# Patient Record
Sex: Male | Born: 1980 | Race: White | Hispanic: Yes | Marital: Single | Smoking: Never smoker
Health system: Southern US, Community
[De-identification: ages and names within clinical notes are randomized; demographics above are authoritative.]

---

## 2005-02-28 ENCOUNTER — Emergency Department (HOSPITAL_COMMUNITY): Admission: EM | Admit: 2005-02-28 | Discharge: 2005-02-28 | Payer: Self-pay | Admitting: Emergency Medicine

## 2005-03-02 ENCOUNTER — Emergency Department (HOSPITAL_COMMUNITY): Admission: EM | Admit: 2005-03-02 | Discharge: 2005-03-02 | Payer: Self-pay | Admitting: Emergency Medicine

## 2008-04-14 ENCOUNTER — Emergency Department (HOSPITAL_COMMUNITY): Admission: EM | Admit: 2008-04-14 | Discharge: 2008-04-14 | Payer: Self-pay | Admitting: Emergency Medicine

## 2009-10-10 ENCOUNTER — Emergency Department (HOSPITAL_COMMUNITY): Admission: EM | Admit: 2009-10-10 | Discharge: 2009-10-10 | Payer: Self-pay | Admitting: Emergency Medicine

## 2009-11-13 ENCOUNTER — Emergency Department (HOSPITAL_COMMUNITY): Admission: EM | Admit: 2009-11-13 | Discharge: 2009-11-14 | Payer: Self-pay | Admitting: Emergency Medicine

## 2009-11-18 ENCOUNTER — Encounter (HOSPITAL_COMMUNITY): Admission: RE | Admit: 2009-11-18 | Discharge: 2009-12-18 | Payer: Self-pay | Admitting: Oncology

## 2010-10-19 LAB — COMPREHENSIVE METABOLIC PANEL
ALT: 63 U/L — ABNORMAL HIGH (ref 0–53)
AST: 37 U/L (ref 0–37)
BUN: 16 mg/dL (ref 6–23)
Creatinine, Ser: 0.84 mg/dL (ref 0.4–1.5)
GFR calc Af Amer: >60 mL/min (ref >60)
GFR calc non Af Amer: >60 mL/min (ref >60)
Potassium: 3.8 mEq/L (ref 3.5–5.1)
Sodium: 141 mEq/L (ref 135–145)
Total Bilirubin: 0.6 mg/dL (ref 0.3–1.2)

## 2010-10-19 LAB — URINALYSIS, ROUTINE W REFLEX MICROSCOPIC
Bilirubin Urine: NEGATIVE
Hgb urine dipstick: NEGATIVE
Ketones, ur: NEGATIVE mg/dL
Nitrite: NEGATIVE
Specific Gravity, Urine: >1.03 — ABNORMAL HIGH (ref 1.005–1.030)
Urobilinogen, UA: 0.2 mg/dL (ref 0.0–1.0)

## 2010-10-19 LAB — DIFFERENTIAL
Basophils Absolute: 0.1 10*3/uL (ref 0.0–0.1)
Monocytes Absolute: 0.6 10*3/uL (ref 0.1–1.0)
Monocytes Relative: 6 % (ref 3–12)

## 2010-10-19 LAB — CBC
HCT: 38.5 % — ABNORMAL LOW (ref 39.0–52.0)
Hemoglobin: 13.7 g/dL (ref 13.0–17.0)
MCHC: 35.5 g/dL (ref 30.0–36.0)

## 2010-10-24 LAB — COMPREHENSIVE METABOLIC PANEL
AST: 25 U/L (ref 0–37)
BUN: 14 mg/dL (ref 6–23)
Chloride: 107 mEq/L (ref 96–112)
GFR calc non Af Amer: >60 mL/min (ref >60)
Glucose, Bld: 99 mg/dL (ref 70–99)
Potassium: 3.8 mEq/L (ref 3.5–5.1)
Total Bilirubin: 0.7 mg/dL (ref 0.3–1.2)

## 2010-10-24 LAB — URINE MICROSCOPIC-ADD ON

## 2010-10-24 LAB — URINALYSIS, ROUTINE W REFLEX MICROSCOPIC
Bilirubin Urine: NEGATIVE
Glucose, UA: NEGATIVE mg/dL
Leukocytes, UA: NEGATIVE
Specific Gravity, Urine: >1.03 — ABNORMAL HIGH (ref 1.005–1.030)
pH: 6 (ref 5.0–8.0)

## 2010-10-24 LAB — CBC
RBC: 4.53 MIL/uL (ref 4.22–5.81)
RDW: 14.1 % (ref 11.5–15.5)
WBC: 6 10*3/uL (ref 4.0–10.5)

## 2010-10-24 LAB — URINE CULTURE
Colony Count: NO GROWTH
Culture: NO GROWTH

## 2010-10-24 LAB — DIFFERENTIAL
Basophils Absolute: 0 10*3/uL (ref 0.0–0.1)
Eosinophils Relative: 2 % (ref 0–5)
Lymphocytes Relative: 35 % (ref 12–46)

## 2010-10-24 LAB — LIPASE, BLOOD: Lipase: 18 U/L (ref 11–59)

## 2011-04-18 IMAGING — CT CT ABD-PELV W/O CM
2 of 4 series · 17 of 46 positions shown, 19 images · non-contrast
Comparison: None.

CLINICAL DATA: Abdominal pain.  Right-sided flank pain and groin
pain.  Difficulty urinating.  History of kidney problems as a
child.  No visible hematuria.

CT ABDOMEN AND PELVIS WITHOUT CONTRAST
TECHNIQUE: Multidetector CT imaging of the abdomen and pelvis was
performed following the standard protocol without intravenous
contrast.

[Series 2: standard/full over (age)lbs 5.0 · axial · 0.66mm/px · z∈[-474,-38]mm · 14 of 95 slices shown, 16 images]
[im 4/95  soft-tissue]
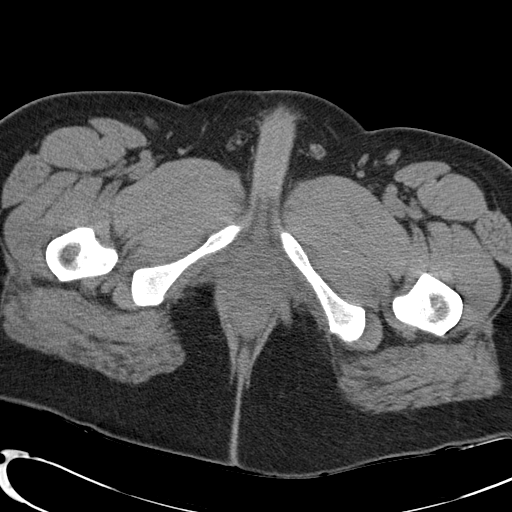
[im 4/95  bone]
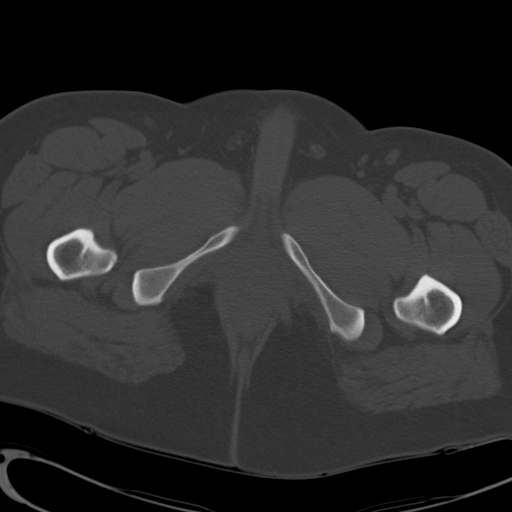
[im 12/95  soft-tissue]
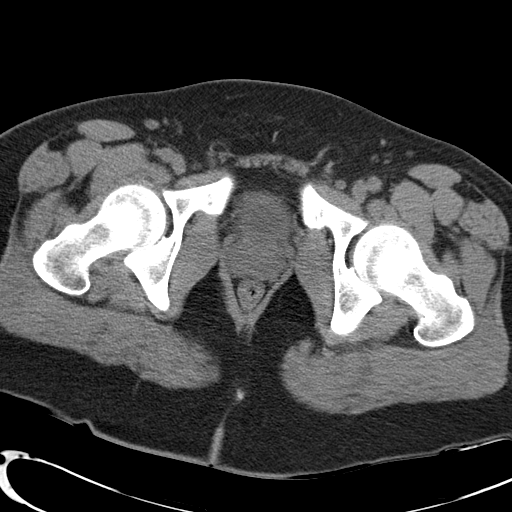
[im 19/95  soft-tissue]
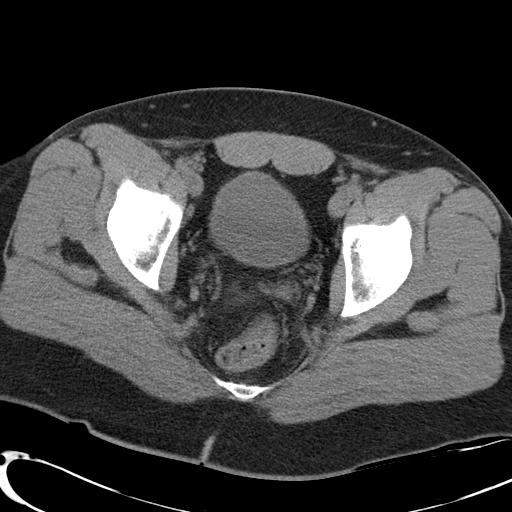
[im 27/95  soft-tissue]
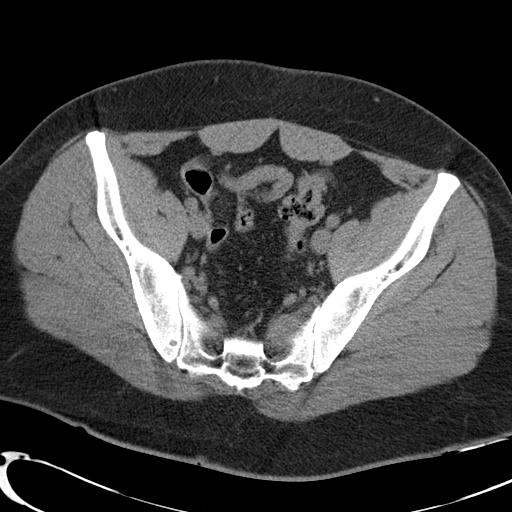
[im 31/95  soft-tissue]
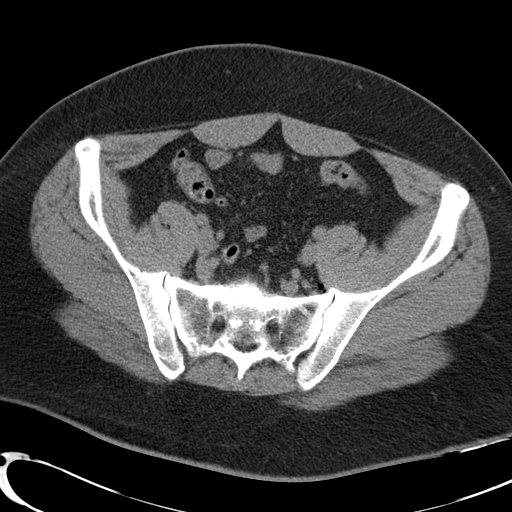
[im 38/95  soft-tissue]
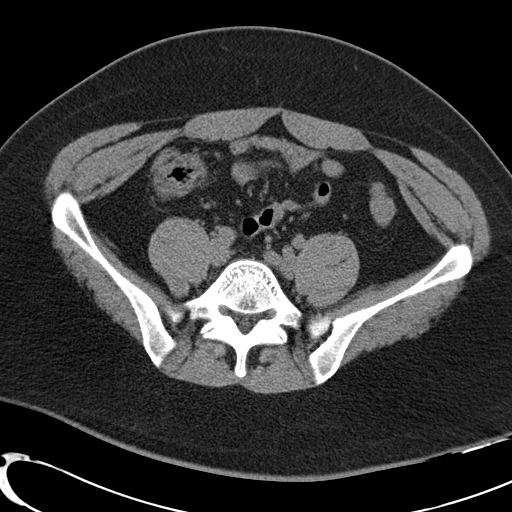
[im 46/95  soft-tissue]
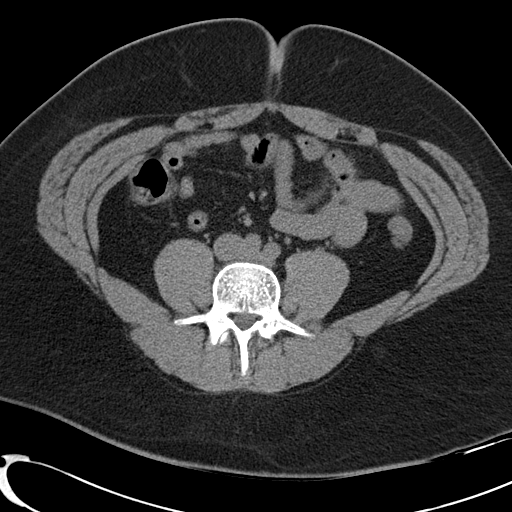
[im 49/95  soft-tissue]
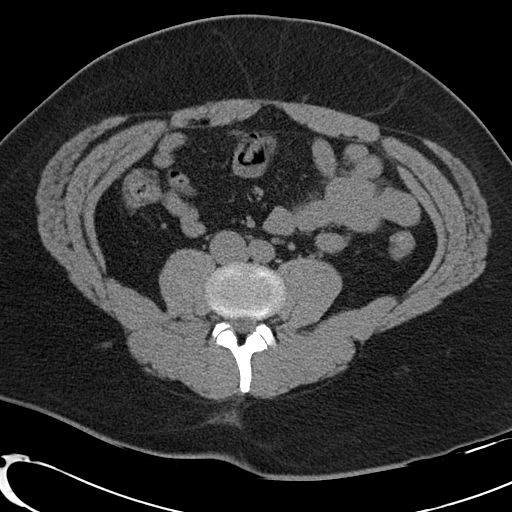
[im 57/95  soft-tissue]
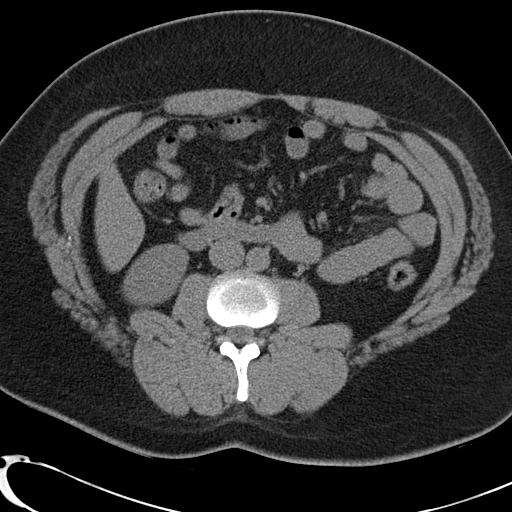
[im 57/95  bone]
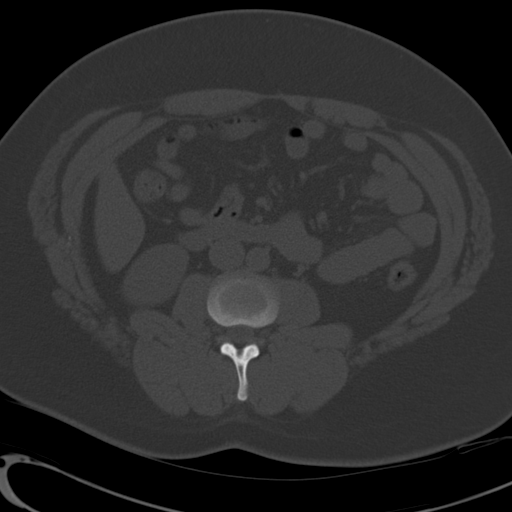
[im 64/95  soft-tissue]
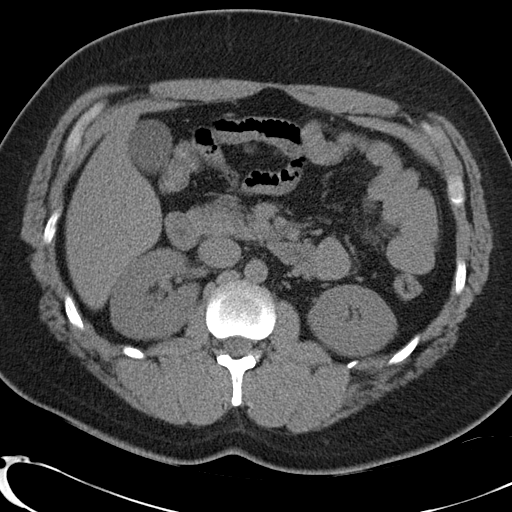
[im 72/95  soft-tissue]
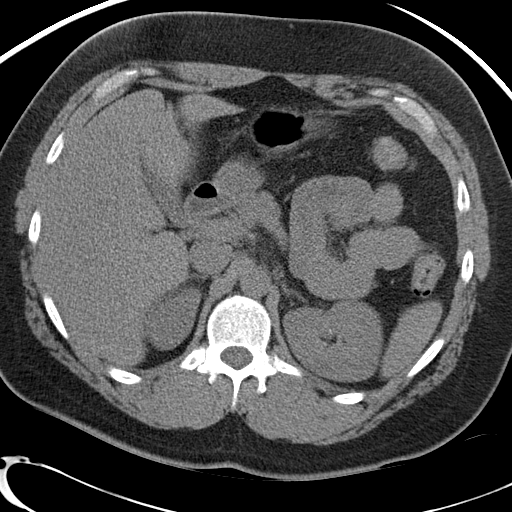
[im 76/95  soft-tissue]
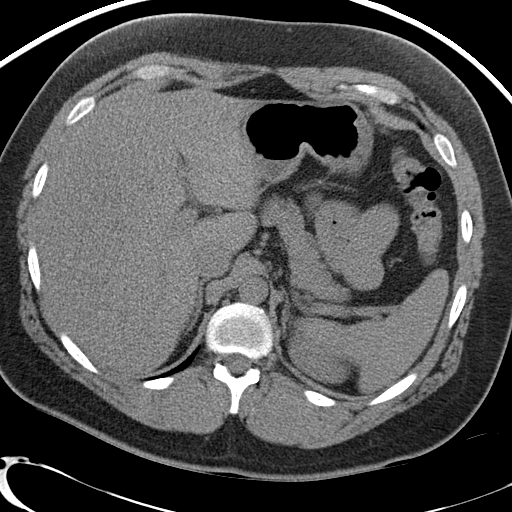
[im 83/95  soft-tissue]
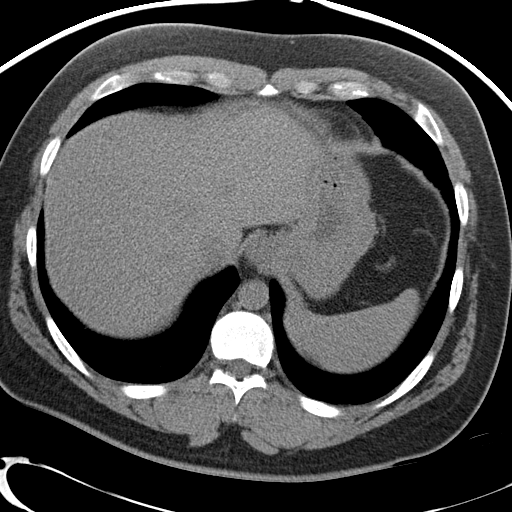
[im 91/95  soft-tissue]
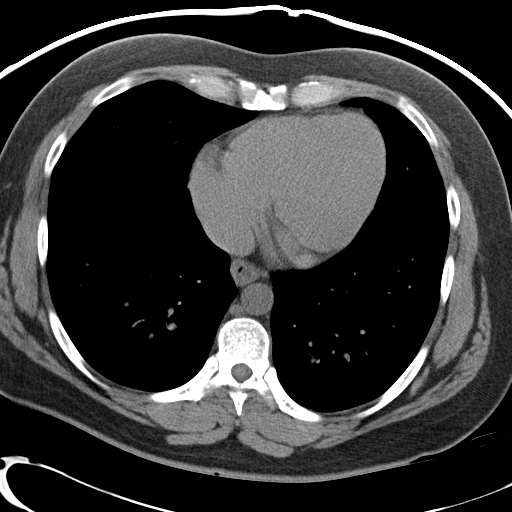

[Series 4: mpr coronal · coronal · 0.77mm/px · 3 of 91 slices shown]
[im 31/91  soft-tissue]
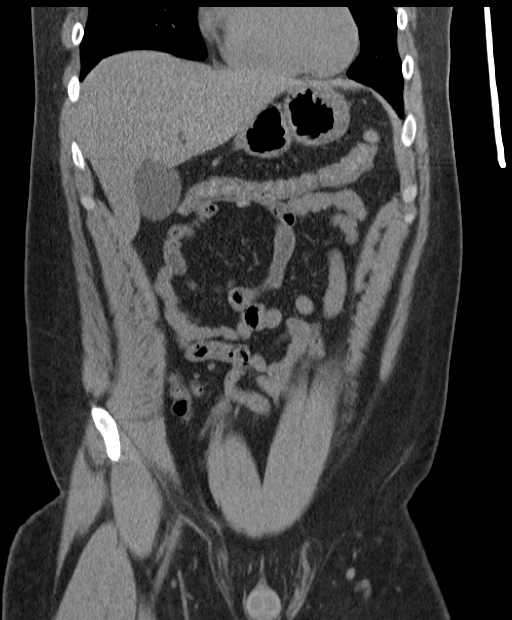
[im 41/91  soft-tissue]
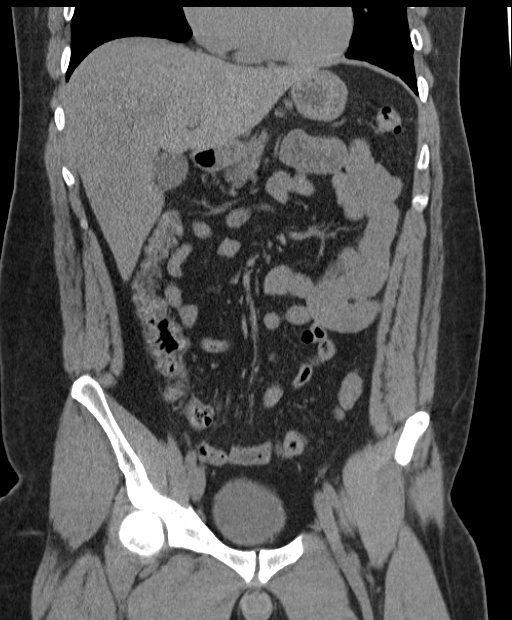
[im 51/91  soft-tissue]
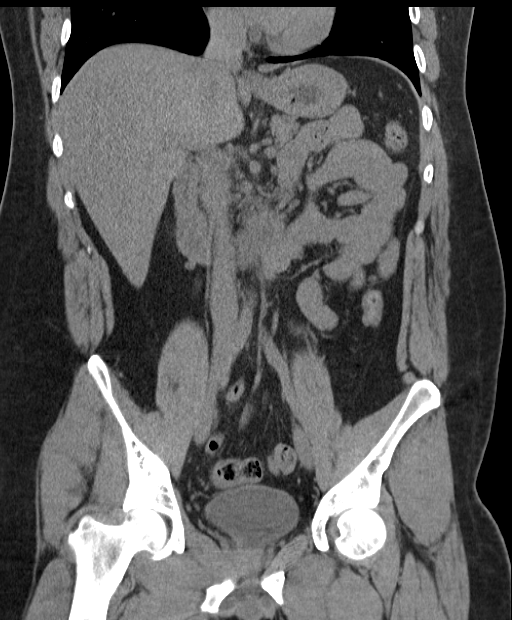

[17 of 46 positions shown; findings below may reference images not displayed]

FINDINGS: Images of the lung bases are unremarkable.  No intra
renal or ureteral calculi identified.  No focal abnormality within
the liver, spleen, pancreas, adrenal glands, kidneys.  The
gallbladder is present.  No retroperitoneal adenopathy or ascites.
The appendix is well seen and has a normal appearance.  No
dilatation of bowel loops or bowel wall thickening.  No
retroperitoneal adenopathy or ascites.  The aorta is normal in
appearance. There is no free pelvic fluid or pelvic adenopathy.
IMPRESSION: 1.  No evidence for urinary tract calculi.
2. No evidence for acute abdominal or pelvic abnormality.

## 2011-06-11 ENCOUNTER — Emergency Department (HOSPITAL_COMMUNITY)
Admission: EM | Admit: 2011-06-11 | Discharge: 2011-06-11 | Disposition: A | Discharge status: Home / Self Care | Payer: Worker's Compensation | Attending: Emergency Medicine | Admitting: Emergency Medicine

## 2011-06-11 ENCOUNTER — Encounter: Payer: Self-pay | Admitting: *Deleted

## 2011-06-11 ENCOUNTER — Emergency Department (HOSPITAL_COMMUNITY): Payer: Worker's Compensation

## 2011-06-11 DIAGNOSIS — F17200 Nicotine dependence, unspecified, uncomplicated: Secondary | ICD-10-CM | POA: Insufficient documentation

## 2011-06-11 DIAGNOSIS — S6000XA Contusion of unspecified finger without damage to nail, initial encounter: Secondary | ICD-10-CM | POA: Insufficient documentation

## 2011-06-11 DIAGNOSIS — M79609 Pain in unspecified limb: Secondary | ICD-10-CM | POA: Insufficient documentation

## 2011-06-11 DIAGNOSIS — Y990 Civilian activity done for income or pay: Secondary | ICD-10-CM | POA: Insufficient documentation

## 2011-06-11 DIAGNOSIS — IMO0002 Reserved for concepts with insufficient information to code with codable children: Secondary | ICD-10-CM | POA: Insufficient documentation

## 2011-06-11 DIAGNOSIS — S60011A Contusion of right thumb without damage to nail, initial encounter: Secondary | ICD-10-CM

## 2011-06-11 DIAGNOSIS — R209 Unspecified disturbances of skin sensation: Secondary | ICD-10-CM | POA: Insufficient documentation

## 2011-06-11 MED ORDER — KETOROLAC TROMETHAMINE 60 MG/2ML IM SOLN
60.0000 mg | Freq: Once | INTRAMUSCULAR | Status: AC
  Administered 2011-06-11: 60 mg via INTRAMUSCULAR
  Filled 2011-06-11: qty 2

## 2011-06-11 MED ORDER — NAPROXEN 500 MG PO TABS: 500.0000 mg | ORAL_TABLET | Freq: Two times a day (BID) | ORAL | Status: DC

## 2011-06-11 NOTE — ED Notes (Signed)
Pt carried to lab for uds for worker comp.

## 2011-06-11 NOTE — ED Notes (Signed)
Pt given discharge instructions, paperwork & prescription(s), pt verbalized understanding.   

## 2011-06-11 NOTE — ED Provider Notes (Signed)
History     CSN: [id number] Arrival date & time: 06/11/2011  2:15 AM   First MD Initiated Contact with Patient 06/11/11 [phone]      Chief Complaint  Patient presents with  . Hand Injury    (Consider location/radiation/quality/duration/timing/severity/associated sxs/prior treatment) HPI Comments: Patient states that just prior to arrival at work he struck his thumb on the right hand on a hard surface at work. This was acute in onset, the pain is constant, worse with palpation and not associated with numbness or weakness. He denies deformity but has some bruising to the dorsal aspect of the base of the thumb.  Patient is a 30 y.o. male presenting with hand injury. The history is provided by the patient.  Hand Injury  The incident occurred 1 to 2 hours ago. The incident occurred at work. The injury mechanism was a direct blow. Pain location: Right thumb and hand. The quality of the pain is described as throbbing and aching. The pain is moderate. The pain has been constant since the incident. The symptoms are aggravated by movement and palpation. He has tried ice for the symptoms. The treatment provided no relief.   History reviewed. No pertinent past medical history.  History reviewed. No pertinent past surgical history.  History reviewed. No pertinent family history.  History  Substance Use Topics  . Smoking status: Current Everyday Smoker  . Smokeless tobacco: Not on file  . Alcohol Use: No      Review of Systems  Gastrointestinal: Negative for nausea.  Skin:       Bruising  Neurological: Negative for numbness.    Allergies  Review of patient's allergies indicates no known allergies.  Home Medications   Current Outpatient Rx  Name Route Sig Dispense Refill  . IBUPROFEN 800 MG PO TABS Oral Take 800 mg by mouth every 8 (eight) hours as needed.      Marland Kitchen NAPROXEN 500 MG PO TABS Oral Take 1 tablet (500 mg total) by mouth 2 (two) times daily. 30 tablet 0    BP 145/89   Pulse 72  Temp 97.6 F (36.4 C)  Resp 20  Ht 5\' 3"  (1.6 m)  Wt 180 lb (81.647 kg)  BMI 31.89 kg/m2  SpO2 100%  Physical Exam  Constitutional: He appears well-developed and well-nourished. No distress.  Eyes: Conjunctivae are normal. No scleral icterus.  Cardiovascular: Normal rate, regular rhythm and normal heart sounds.   Pulmonary/Chest: Effort normal and breath sounds normal.  Musculoskeletal: He exhibits tenderness ( Tender to palpation over the base of the right thumb and over the MCP. Decreased range of motion of this joint with mild bruising dorsally). He exhibits no edema.  Neurological: He is alert.       Normal gait, normal sensation to right and and thumb  Skin: Skin is warm and dry. He is not diaphoretic.       Mild bruising to the dorsal aspect of the right proximal first digit    ED Course  Procedures (including critical care time)  Labs Reviewed - No data to display Dg Finger Thumb Right  06/11/2011  *RADIOLOGY REPORT*  Clinical Data: Blunt trauma to the base of the first digit of the right hand, with pain and numbness.  RIGHT THUMB 2+V  Comparison: None.  Findings: There is no evidence of fracture or dislocation. Visualized joint spaces are preserved.  The visualized portions of the carpal rows appear intact.  No significant soft tissue abnormalities are characterized on radiograph.  IMPRESSION:  No evidence of fracture or dislocation.  Original Report Authenticated By: Tonia Ghent, M.D.     1. Contusion of right thumb       MDM  X-ray pending rule out fracture, suspect contusion with possible fracture, ice, elevation, anti-inflammatories.  X-ray shows no signs of fracture, Toradol given in the emergency department, referral for orthopedics when necessary poor improvement      Vida Roller, MD 06/11/11 [phone]

## 2011-06-11 NOTE — ED Notes (Signed)
Pt c/o right hand/thumb pain.

## 2011-07-08 ENCOUNTER — Encounter (HOSPITAL_COMMUNITY): Payer: Self-pay

## 2011-07-08 ENCOUNTER — Emergency Department (HOSPITAL_COMMUNITY)
Admission: EM | Admit: 2011-07-08 | Discharge: 2011-07-08 | Disposition: A | Discharge status: Home / Self Care | Payer: Self-pay | Attending: Emergency Medicine | Admitting: Emergency Medicine

## 2011-07-08 DIAGNOSIS — M549 Dorsalgia, unspecified: Secondary | ICD-10-CM | POA: Insufficient documentation

## 2011-07-08 DIAGNOSIS — F17200 Nicotine dependence, unspecified, uncomplicated: Secondary | ICD-10-CM | POA: Insufficient documentation

## 2011-07-08 DIAGNOSIS — J3489 Other specified disorders of nose and nasal sinuses: Secondary | ICD-10-CM | POA: Insufficient documentation

## 2011-07-08 DIAGNOSIS — R1031 Right lower quadrant pain: Secondary | ICD-10-CM

## 2011-07-08 DIAGNOSIS — R6883 Chills (without fever): Secondary | ICD-10-CM | POA: Insufficient documentation

## 2011-07-08 DIAGNOSIS — IMO0001 Reserved for inherently not codable concepts without codable children: Secondary | ICD-10-CM | POA: Insufficient documentation

## 2011-07-08 DIAGNOSIS — R05 Cough: Secondary | ICD-10-CM

## 2011-07-08 DIAGNOSIS — R059 Cough, unspecified: Secondary | ICD-10-CM | POA: Insufficient documentation

## 2011-07-08 DIAGNOSIS — R109 Unspecified abdominal pain: Secondary | ICD-10-CM | POA: Insufficient documentation

## 2011-07-08 DIAGNOSIS — R070 Pain in throat: Secondary | ICD-10-CM | POA: Insufficient documentation

## 2011-07-08 MED ORDER — HYDROCODONE-ACETAMINOPHEN 5-325 MG PO TABS
1.0000 | ORAL_TABLET | Freq: Once | ORAL | Status: AC
  Administered 2011-07-08: 1 via ORAL
  Filled 2011-07-08: qty 1

## 2011-07-08 MED ORDER — AZITHROMYCIN 250 MG PO TABS: ORAL_TABLET | ORAL | Status: AC

## 2011-07-08 MED ORDER — GUAIFENESIN-CODEINE 100-10 MG/5ML PO SYRP: 10.0000 mL | ORAL_SOLUTION | Freq: Three times a day (TID) | ORAL | Status: AC | PRN

## 2011-07-08 MED ORDER — AZITHROMYCIN 250 MG PO TABS
500.0000 mg | ORAL_TABLET | Freq: Once | ORAL | Status: AC
  Administered 2011-07-08: 500 mg via ORAL
  Filled 2011-07-08: qty 2

## 2011-07-08 NOTE — ED Notes (Signed)
1. ?boil to lower back area for 1 week 2. Body aches, cough and sore throat for 1 week

## 2011-07-08 NOTE — ED Provider Notes (Signed)
History     CSN: [id number] Arrival date & time: 07/08/2011  7:44 PM   First MD Initiated Contact with Patient 07/08/11 1946      Chief Complaint  Patient presents with  . Wound Check  . Cough    body aches    (Consider location/radiation/quality/duration/timing/severity/associated sxs/prior treatment) HPI Comments: Patient c/o body aches, productive cough, chills and sore throat for one week.  He states that he was seen last year for a "torn groin muscle" last year and states that his persistent coughing has caused pain to the previously injured area.  He describes pain to his right groin and right lower back.  He denies masses, vomiting, hematuria, discharge from his penis or difficulty urinating.    Patient is a 30 y.o. male presenting with cough. The history is provided by the patient.  Cough This is a new problem. The current episode started more than 2 days ago. The problem occurs every few minutes. The problem has not changed since onset.The cough is productive of sputum. There has been no fever. Associated symptoms include chills, rhinorrhea, sore throat and myalgias. Pertinent negatives include no chest pain, no ear congestion, no ear pain, no headaches, no shortness of breath and no wheezing. He has tried nothing for the symptoms. The treatment provided no relief. He is a smoker. His past medical history does not include bronchitis, pneumonia, COPD, emphysema or asthma.    History reviewed. No pertinent past medical history.  History reviewed. No pertinent past surgical history.  History reviewed. No pertinent family history.  History  Substance Use Topics  . Smoking status: Current Everyday Smoker  . Smokeless tobacco: Not on file  . Alcohol Use: No      Review of Systems  Constitutional: Positive for chills. Negative for fever, activity change and appetite change.  HENT: Positive for sore throat and rhinorrhea. Negative for ear pain, trouble swallowing, neck pain  and neck stiffness.   Respiratory: Positive for cough. Negative for chest tightness, shortness of breath and wheezing.   Cardiovascular: Negative for chest pain.  Gastrointestinal: Negative for nausea, vomiting, abdominal pain, diarrhea and abdominal distention.  Genitourinary: Negative for dysuria, hematuria, flank pain, decreased urine volume, discharge, penile swelling and testicular pain.  Musculoskeletal: Positive for myalgias and back pain. Negative for arthralgias.  Skin: Negative.  Negative for color change, rash and wound.  Neurological: Negative for dizziness, weakness, numbness and headaches.  Hematological: Does not bruise/bleed easily.  All other systems reviewed and are negative.    Allergies  Review of patient's allergies indicates no known allergies.  Home Medications  No current outpatient prescriptions on file.  BP 124/83  Pulse 78  Temp(Src) 98.1 F (36.7 C) (Oral)  Resp 22  Ht 5\' 3"  (1.6 m)  Wt 185 lb (83.915 kg)  BMI 32.77 kg/m2  SpO2 100%  Physical Exam  Nursing note and vitals reviewed. Constitutional: He is oriented to person, place, and time. Vital signs are normal. He appears well-developed and well-nourished.  Non-toxic appearance. He does not have a sickly appearance. He does not appear ill. No distress.  HENT:  Head: Normocephalic and atraumatic.  Right Ear: Tympanic membrane and ear canal normal.  Left Ear: Tympanic membrane and ear canal normal.  Nose: Nose normal.  Mouth/Throat: Uvula is midline and mucous membranes are normal. Posterior oropharyngeal erythema present. No oropharyngeal exudate, posterior oropharyngeal edema or tonsillar abscesses.  Neck: Normal range of motion. Neck supple.  Cardiovascular: Normal rate, regular rhythm and normal heart  sounds.   Pulmonary/Chest: Effort normal and breath sounds normal. No respiratory distress. He has no wheezes. He has no rales. He exhibits no tenderness.       Coarse lung sounds throughout.  No  rales or wheezes  Abdominal: Soft. He exhibits no distension and no mass. There is no tenderness. There is no rebound and no guarding.  Musculoskeletal: Normal range of motion. He exhibits tenderness. He exhibits no edema.       Lumbar back: He exhibits tenderness. He exhibits normal range of motion, no swelling and normal pulse.       Back:       Legs: Lymphadenopathy:    He has no cervical adenopathy.       Right: No inguinal adenopathy present.  Neurological: He is alert and oriented to person, place, and time. He has normal reflexes. No cranial nerve deficit. He exhibits normal muscle tone. Coordination normal.  Skin: Skin is warm and dry.    ED Course  Procedures (including critical care time)       MDM    9:46 PM patient has ttp of the right lower lumbar paraspinal muscles and right groin area.  Pain is reproduced with abduction and flexion of the right leg.  PAtient has hx of "right inguinal hernia" March of 2011.  PAin tonight feels the same and began after excessive cough.  Vitals are stable, he is non-toxic appearing, abd is soft, NT, no guarding or rebound tenderness.  No palpable masses of the abd or inguinal area.  I will prescribe cough medication and abx, he agrees to close f/u with his PMD   I have reviewed the previous ED chart and imaging.  Pt was referred to Dr. Franky Macho        Daelen Belvedere L. Detra Bores, PA 07/11/11 1344

## 2011-07-08 NOTE — ED Notes (Signed)
Pt a/ox4. resp even and unlabored. NAD at this time. D/C instructions reviewed with pt. Pt verbalized understanding. Pt ambulated to lobby with steady gate.  

## 2011-07-15 NOTE — ED Provider Notes (Signed)
Evaluation and management procedures were performed by the PA/NP under my supervision/collaboration.    Courtne Lighty D Zyiere Rosemond, MD 07/15/11 1137 

## 2014-10-09 ENCOUNTER — Emergency Department (HOSPITAL_COMMUNITY)
Admission: EM | Admit: 2014-10-09 | Discharge: 2014-10-09 | Disposition: A | Discharge status: Home / Self Care | Payer: Worker's Compensation | Attending: Emergency Medicine | Admitting: Emergency Medicine

## 2014-10-09 ENCOUNTER — Emergency Department (HOSPITAL_COMMUNITY): Payer: Worker's Compensation

## 2014-10-09 ENCOUNTER — Encounter (HOSPITAL_COMMUNITY): Payer: Self-pay | Admitting: Emergency Medicine

## 2014-10-09 DIAGNOSIS — Y9289 Other specified places as the place of occurrence of the external cause: Secondary | ICD-10-CM | POA: Insufficient documentation

## 2014-10-09 DIAGNOSIS — M79672 Pain in left foot: Secondary | ICD-10-CM

## 2014-10-09 DIAGNOSIS — Y9389 Activity, other specified: Secondary | ICD-10-CM | POA: Insufficient documentation

## 2014-10-09 DIAGNOSIS — S93602A Unspecified sprain of left foot, initial encounter: Secondary | ICD-10-CM

## 2014-10-09 DIAGNOSIS — X58XXXA Exposure to other specified factors, initial encounter: Secondary | ICD-10-CM | POA: Insufficient documentation

## 2014-10-09 DIAGNOSIS — Y990 Civilian activity done for income or pay: Secondary | ICD-10-CM | POA: Insufficient documentation

## 2014-10-09 DIAGNOSIS — Z720 Tobacco use: Secondary | ICD-10-CM | POA: Insufficient documentation

## 2014-10-09 MED ORDER — OXYCODONE-ACETAMINOPHEN 5-325 MG PO TABS: 1.0000 | ORAL_TABLET | ORAL | Status: AC | PRN

## 2014-10-09 MED ORDER — OXYCODONE-ACETAMINOPHEN 5-325 MG PO TABS
1.0000 | ORAL_TABLET | Freq: Once | ORAL | Status: AC
  Administered 2014-10-09: 1 via ORAL
  Filled 2014-10-09: qty 1

## 2014-10-09 MED ORDER — IBUPROFEN 800 MG PO TABS
800.0000 mg | ORAL_TABLET | Freq: Once | ORAL | Status: AC
  Administered 2014-10-09: 800 mg via ORAL
  Filled 2014-10-09: qty 1

## 2014-10-09 NOTE — Discharge Instructions (Signed)
Esguince del pie (Foot Sprain) Los msculos y los tendones (estructuras similares a cuerdas que unen el msculo al hueso) que rodean el pie estn formados por unidades. El esguince se produce en el punto ms dbil de esas unidades. Este trastorno generalmente est ocasionado por una lesin o un uso excesivo del pie, como por ejemplo en la prctica de deportes, o cuando se agrava una lesin anterior, o debido a condiciones deficientes, o en casos de obesidad. SNTOMAS   Dolor con Conservation officer, historic buildings.  Sensibilidad e hinchazn de la zona lesionada.  Prdida de la fuerza en los esguinces moderados o graves. LOS TRES GRADOS DE GRAVEDAD DEL ESGUINCE DEL PIE SON:  1. Leve Gae Bon I): El msculo ligeramente desgarrado, sin ruptura de fibras musculares o tendones ni prdida de la fuerza. 2. Moderado Gae Bon II): Ruptura de las [address], del tendn o de la unin al [place], con leve disminucin de la fuerza. 3. Grave (Grado III): Ruptura de la unin msculo-tendn-hueso, con separacin de las fibras. El esguince grave requiere reparacin [place]. Los esguinces crnicos (que se repiten a menudo), estn causados por el uso excesivo. Los esguinces agudos (repentinos) estn ocasionados por una lesin directa o por el uso excesivo. DIAGNSTICO El diagnstico de este problema generalmente se hace por observacin. Si el problema persiste, el profesional que lo asiste podr requerir una evaluacin ms profunda y Pharmacist, community. Podrn solicitarle radiografas para comprobar que no ha sufrido Market researcher (rotura de los [place]). Si los problemas continan podr necesitar un tratamiento de fisioterapia. PREVENCIN 1. Practique los ejercicios de entrenamiento y de fuerza adecuados para el deporte que usted Therapist, occupational. 2. Haga un correcto calentamiento antes de comenzar la actividad fsica. 3. Use las zapatillas que fueron diseadas para el deporte que Personal assistant. 4. Permita que pase el suficiente tiempo para que  pueda curarse. Si regresa a la actividad antes de tiempo ser ms susceptible de Psychologist, educational, y esto puede conducirlo a un pie artrtico inestable que tendr como resultado una discapacidad prolongada. Los esguinces leves generalmente tardan entre 3 y [address] para curarse y los moderados y graves entre 2 y 10 semanas. El profesional que lo asiste puede ayudarlo a Warehouse manager tiempo apropiado que requerir la curacin. INSTRUCCIONES PARA EL CUIDADO DOMICILIARIO 1. Aplique hielo en la lesin durante 15 a 20 minutos, 3 a 4 veces por da. Ponga el hielo en una bolsa plstica y coloque una toalla entre la bolsa y la piel. 2. Puede usar una venda elstica (como un vendaje Ace) para disminuir la hinchazn. 3. Mantenga el pie por encima del nivel del corazn, o al menos mantngalo elevado en una banqueta mientras tenga hinchazn y Engineer, mining. 4. Evite todo rango de movimiento que no sea moderado [employer] duela. No reinicie los movimientos hasta que se lo indique el profesional que lo asiste. Luego comience gradualmente, evitando llegar al punto en que le duela. Si el dolor aparece, disminuya la actividad y contine con las medidas indicadas ms arriba e incremente gradualmente las actividades que no le produzcan molestias hasta que progresivamente logre la actividad normal. 5. Utilice muletas si se las han indicado y por Museum/gallery conservator prescrito. 6. Si lo dispone, utilice un hidromasaje. 7. Mantenga vendado el pie y el tobillo lesionado entre un tratamiento y [place]. 8. Masajee el pie y el tobillo para Paramedic las molestias y disminuir la hinchazn. Masajee desde los dedos [employer] rodilla. 9. Utilice los medicamentos de venta libre o de prescripcin para el  dolor, el malestar o la fiebre, segn se lo indique el profesional que lo asiste. SOLICITE ATENCIN MDICA DE INMEDIATO SI: 1. El dolor o la inflamacin aumentan o el dolor es incontrolable, an con Tourist information centre managerla medicacin. 2. Ha perdido la sensibilidad del pie  o ste se enfra o se vuelve de color azul. 3. Presenta sntomas nuevos o desconocidos o se incrementan los sntomas que lo llevaron a la consulta. EST SEGURO QUE:  1. Comprende las instrucciones para el alta mdica. 2. Controlar su enfermedad. 3. Solicitar atencin mdica de inmediato segn las indicaciones. Document Released: 07/18/2005 Document Revised: 10/10/2011 [facility] Patient Information 2015 [place]ExitCare, [place]. This information is not intended to replace advice given to you by your health care provider. Make sure you discuss any questions you have with your health care provider.  Uso de Murphy Oilmuletas Administrator(Crutch Use) Las Murphy Oilmuletas se utilizan para Paramedicaliviar el peso de Movilleuna de sus piernas o pies cuando est parado o camina. Es importante usar Pulte Homesmuletas que le calcen Leonaadecuadamente. Las Murphy Oilmuletas calzan adecuadamente cuando:  Debe haber un espacio de 2 a 3 dedos entre cada muleta y Management consultantla axila.  El peso debe recaer en su mano y no en la axila.  RIESGOS Y COMPLICACIONES Dao a los nervios que se extiende desde la axila hacia la mano y el brazo. Para evitar que esto suceda, asegrese de que las National Citymuletas calcen adecuadamente y no aplique presin en la axila cuando las Bowlerutilice. CMO USAR SUS MULETAS Si le han indicado que soporte parcialmente el peso, aplique (soporte) la cantidad de peso que le sugiera su mdico. No soporte un peso que le ocasione dolor en la zona lesionada. Caminar 4. Prese con las muletas. 5. Balancee la pierna sana levemente por delante de las muletas. Subir escalones Si no hay barandas: 5. Suba con la pierna sana. 6. Suba con las H[place] Heightsmuletas y la pierna lesionada. 7. Contine de esta forma. Si hay barandas: 10. Sostenga ambas muletas en una mano. 11. Coloque la Ameren Corporationmano libre en la baranda. 12. Mientras sostiene el peso con los brazos, levante la pierna sana y colquela sobre el escaln. 13. Lleve las muletas y la pierna lesionada hasta el escaln. 14. Contine de esta forma. Going Down  Steps Tenga mucho cuidado, ya que bajar escaleras con muletas puede ser peligroso. Si no hay barandas: 4. Baje con la pierna lesionada y las Allendalemuletas. 5. Baje con la pierna sana. Si hay barandas: 4. Coloque la Countrywide Financialmano en la baranda. 5. Sostenga ambas muletas con la [place] Femano libre. 6. Baje la pierna lesionada y la [address] hasta el escaln debajo de usted. Asegrese de CBS Corporationmantener las puntas de las muletas en el centro del escaln, nunca en el borde. 7. Baje la pierna sana hasta ese escaln. 8. Contine de esta forma. Pararse 1. Sostenga la pierna lesionada hacia adelante. 2. Agarre el apoyabrazos con Edison Simonuna mano y la parte superior de las muletas con la [place]otra mano. 3. Utilizando estos apoyos, prese. Sentarse 1. Sostenga la pierna lesionada hacia adelante. 2. Agarre el apoyabrazos con Edison Simonuna mano y la parte superior de las muletas con la Red Crossotra mano. 3. Baje el cuerpo Printmakerhasta lograr sentarse. SOLICITE ATENCIN MDICA SI:  An se siente inestable cuando se para.  Desarrolla un nuevo dolor, por ejemplo, en las axilas, la espalda, los hombros, las muecas o la cadera.  Presenta adormecimiento u hormigueo. SOLICITE ATENCIN MDICA DE INMEDIATO SI: Se cae. Document Released: 07/18/2005 Document Revised: 05/08/2013 [facility] Patient Information 2015 EdisonExitCare, [place]LLC. This information is not intended  to replace advice given to you by your health care provider. Make sure you discuss any questions you have with your health care provider.  Acetaminophen; Oxycodone tablets Qu es este medicamento? El compuesto ACETAMINOFENO; OXICODONA es un analgsico. Se utiliza para tratar los dolores leves a moderados. Este medicamento puede ser utilizado para otros usos; si tiene alguna pregunta consulte con su proveedor de atencin mdica o con su farmacutico. MARCAS COMERCIALES DISPONIBLES: Endocet, Magnacet, Narvox, Percocet, Perloxx, Primalev, Primlev, Roxicet, Xolox [employer] debo informar a mi profesional de la salud antes  de tomar este medicamento? Necesita saber si usted presenta alguno de los [employer] o situaciones: -tumor cerebral -enfermedad de Crohn, enfermedad intestinal inflamatoria o colitis ulcerativa -abuso de drogas o drogadiccin -lesin de la cabeza -problemas cardiacos o circulatorios -si consume alcohol con frecuencia -enfermedad renal o problemas al orinar -enfermedad heptica -enfermedad pulmonar, asma o dificultades al respirar -una reaccin alrgica o inusual al acetaminofeno, a la oxicodona, a otros analgsicos opiceos, a otros medicamentos, alimentos, colorantes o conservantes -si est embarazada o buscando quedar embarazada -si est amamantando a un beb Cmo debo utilizar este medicamento? Tome este medicamento por va oral con un vaso lleno de agua. Siga las instrucciones de la etiqueta del [place]. Tome sus dosis a intervalos regulares. No tome su medicamento con una frecuencia mayor que la indicada. Hable con su pediatra para informarse acerca del uso de este medicamento en nios. Puede requerir Customer service manager. Los pacientes de ms de 65 aos de edad pueden presentar reacciones ms fuertes a Industrial/product designer y Pension scheme manager dosis menores. Sobredosis: Pngase en contacto inmediatamente con un centro toxicolgico o una sala de urgencia si usted cree que haya tomado demasiado medicamento. ATENCIN: [employer] es solo para usted. No comparta este medicamento con nadie. Qu sucede si me olvido de una dosis? Si olvida una dosis, tmela lo antes posible. Si es casi la hora de la prxima dosis, tome slo esa dosis. No tome dosis adicionales o dobles. Qu puede interactuar con este medicamento? -alcohol -antihistamnicos -barbitricos tales como el amobarbital, butalbital, butabarbital, metohexital, pentobarbital, fenobarbital, tiopental y secobarbital -benztropina -medicamentos para problemas de vejiga, tales como solifenacina, trospium, oxibutinina, tolterodina,  hiosciamina y metscopolamina -medicamentos para problemas respiratorios, tales como ipratropio y tiotropio -medicamentos para ciertos problemas estomacales o intestinales, tales como propantelina, homatropina metilbromuro, Dispensing optician, atropina, belladona y diciclomina -anestsicos generales, tales como etomidato, [place], xido nitroso, propofol, desflurano, enflurano, halotano, isoflurano y sevoflurano -medicamentos para la depresin, ansiedad o trastornos psicticos -medicamentos para dormir -relajantes musculares -naltrexona -medicamentos narcticos (opiceos) para Chief Technology Officer -fenotiazinas, tales como perfenacina, tioridazina, clorpromacina, mesoridazina, flufenazina, proclorperazina, promazina y trifluoperazina -escopolamina -tramadol -trihexifenidilo Puede ser que esta lista no menciona todas las posibles interacciones. Informe a su profesional de [employer] de [employer] Inc productos a base de hierbas, medicamentos de [place] o suplementos nutritivos que est tomando. Si usted fuma, consume bebidas alcohlicas o si utiliza drogas ilegales, indqueselo tambin a su profesional de [employer]. Algunas sustancias pueden interactuar con su medicamento. A qu debo estar atento al usar PPL Corporation? Si el dolor no desaparece, si empeora o si experimenta un dolor nuevo o de tipo diferente, consulte a su mdico o a su profesional de [employer]. Usted puede desarrollar tolerancia al medicamento. La tolerancia significa que necesitar una dosis ms alta para Engineer, materials. Tolerancia es normal y esperada cuando est tomando este medicamento por un largo perodo de [place]. No suspenda el uso de su medicamento repentinamente debido a que Airline pilot  una reaccin severa. Su cuerpo se acostumbra a Industrial/product designer. Esto NO significa que sea adicto. La adiccin es un comportamiento que hace referencia a la obtencin y utilizacin de un medicamento con fines que no son mdicos. Si tiene Engineer, mining, existe  una razn mdica para que usted tome un analgsico. Su mdico le indicar la cantidad de medicamento que Mudlogger. Si su mdico desea que [employer], la dosis ser reducida gradualmente para Psychiatric nurse secundarios. Puede experimentar somnolencia o mareos. No conduzca ni utilice maquinaria ni haga nada que Scientist, research (life sciences) en estado de alerta hasta que sepa cmo le afecta este medicamento. No se siente ni se ponga de pie con rapidez, especialmente si es un paciente de edad avanzada. Esto reduce el riesgo de mareos o [employer]. El alcohol puede interferir con el efecto de [place]. Evite consumir bebidas alcohlicas. Hay distintos tipos de medicamentos narcticos (opiceos) para Chief Technology Officer. Si usted toma ms que un tipo a la Performance Food Group, podr tener ms Lexmark International. Dar a su proveedor de atencin medica una lista de todos los medicamentos que usted [place]. Su mdico le informar la cantidad de medicamento que Loss adjuster, chartered. No tome ms medicamento que lo indicado. Comunquese con emergencia para ayuda si tiene problemas para respirar. Este medicamento causar estreimiento. Trate de evacuar los intestinos al menos cada 2  3 das. Si no evacua los intestinos durante 3 [address]  [address], comunquese con su mdico o con su profesional de [employer]. No tome Tylenol (acetaminofeno) u otros medicamentos que contienen acetaminofeno con este medicamento. Tomando mucho acetaminofeno puede ser muy peligroso. Muchos medicamentos de venta libre contienen acetaminofeno. Lea siempre las etiquetas cuidadosamente para evitar el tomar ms acetaminofeno. Qu efectos secundarios puedo tener al Boston Scientific este medicamento? Efectos secundarios que debe informar a su mdico o a Producer, television/film/video de la salud tan pronto como sea posible: -Scientist, physiological, tales como erupcin cutnea, picazn o urticarias, hinchazn de la cara, labios o lengua -dificultades respiratorias, sibilancias -confusin -sensacin de  desmayos o aturdimiento -dolor de estmago severo -cansancio o debilidad inusual -color amarillento de los ojos o la piel Efectos secundarios que, por lo general, no requieren atencin mdica (debe informarlos a su mdico o a su profesional de la salud si persisten o si son molestos): -mareos -somnolencia -nuseas -vmitos Puede ser que esta lista no menciona todos los posibles efectos secundarios. Comunquese a su mdico por asesoramiento mdico Hewlett[employer]ard. Usted puede informar los efectos secundarios a la FDA por telfono al 1-800-FDA-1088. Dnde debo guardar mi medicina? Mantngala fuera del alcance de los nios. Este medicamento puede ser abusado. Mantenga su medicamento en un lugar seguro para protegerlo contra robos. No comparta este medicamento con nadie. Es peligroso vender o ceder este medicamento y est prohibido por la ley. Gurdelo a Sanmina-SCI, entre 20 y 25 grados C (92 y 62 grados F). Mantenga el envase bien cerrado. Protjalo de Statistician. Este medicamento puede causar muerte y sobredosis accidental si es tomado por otros adultos, nios o Neurosurgeon. Tire los medicamentos que no haya utilizado al inodoro para reducir la posibilidad de dao. No use el medicamento despus de la fecha de vencimiento. ATENCIN: Este folleto es un resumen. Puede ser que no cubra toda la posible informacin. Si usted tiene preguntas acerca de esta medicina, consulte con su mdico, su farmacutico o su profesional de Radiographer, therapeutic.  2015, Elsevier/Gold Standard. (2013-04-01 19:44:45)

## 2014-10-09 NOTE — ED Provider Notes (Signed)
CSN: [id number]     Arrival date & time 10/09/14  0422 History   First MD Initiated Contact with Patient 10/09/14 [phone]     Chief Complaint  Patient presents with  . Foot Pain     (Consider location/radiation/quality/duration/timing/severity/associated sxs/prior Treatment) Patient is a 34 y.o. male presenting with lower extremity pain. The history is provided by the patient.  Foot Pain  He injured his left foot at work. He had to place it between arterial machine to balance in his foot twisted. Is complaining of pain in the medial aspect of the proximal midfoot with pain radiating up into his lower leg. He denies other injury. There's been no treatment prior to arriving in the ED.  History reviewed. No pertinent past medical history. History reviewed. No pertinent past surgical history. History reviewed. No pertinent family history. History  Substance Use Topics  . Smoking status: Current Every Day Smoker  . Smokeless tobacco: Not on file  . Alcohol Use: No    Review of Systems  All other systems reviewed and are negative.     Allergies  Review of patient's allergies indicates no known allergies.  Home Medications   Prior to Admission medications   Medication Sig Start Date End Date Taking? Authorizing Provider  azithromycin (ZITHROMAX Z-PAK) 250 MG tablet Take two tablets on day one, then one tab qd days 2-5 07/08/11   Tammi Triplett, PA-C   BP 153/89 mmHg  Pulse 72  Temp(Src) 98 F (36.7 C) (Oral)  Resp 16  Ht 5\' 6"  (1.676 m)  Wt 228 lb (103.42 kg)  BMI 36.82 kg/m2  SpO2 99% Physical Exam  Nursing note and vitals reviewed.  34 year old male, resting comfortably and in no acute distress. Vital signs are significant for hypertension. Oxygen saturation is 99%, which is normal. Head is normocephalic and atraumatic. PERRLA, EOMI. Oropharynx is clear. Neck is nontender and supple without adenopathy or JVD. Back is nontender and there is no CVA tenderness. Lungs are  clear without rales, wheezes, or rhonchi. Chest is nontender. Heart has regular rate and rhythm without murmur. Abdomen is soft, flat, nontender without masses or hepatosplenomegaly and peristalsis is normoactive. Extremities: There is marked tenderness in the medial aspect of the left foot and left proximal midfoot with slight swelling in that area. There is mild tenderness over the medial malleolus over the distal left lower leg medially. There is no tenderness laterally. There is no ankle swelling or instability. Distal neurovascular exam is intact.. Skin is warm and dry without rash. Neurologic: Mental status is normal, cranial nerves are intact, there are no motor or sensory deficits.  ED Course  Procedures (including critical care time)  Imaging Review Dg Ankle Complete Left  10/09/2014   CLINICAL DATA:  Left ankle pain after injury at work earlier this evening. Twisting injury.  EXAM: LEFT ANKLE COMPLETE - 3+ VIEW  COMPARISON:  None.  FINDINGS: No fracture or dislocation. The alignment and joint spaces are maintained. The ankle mortise is preserved. No focal soft tissue abnormality or evidence of joint effusion.  IMPRESSION: No fracture or dislocation of the left ankle.   Electronically Signed   By: Rubye OaksMelanie  Ehinger M.D.   On: 10/09/2014 05:36   Dg Foot Complete Left  10/09/2014   CLINICAL DATA:  Left foot pain, injured at work this evening. Twisting injury.  EXAM: LEFT FOOT - COMPLETE 3+ VIEW  COMPARISON:  None.  FINDINGS: No fracture or dislocation. The alignment and joint spaces are maintained.  Incidental note of os peroneum. No focal soft tissue abnormality.  IMPRESSION: No fracture or dislocation of the left foot.   Electronically Signed   By: Rubye Oaks M.D.   On: 10/09/2014 05:35   Images viewed by me.  MDM   Final diagnoses:  Pain in left foot  Sprain of left foot, initial encounter    Left foot injury. X-rays have been ordered.  X-rays are negative for fracture. He  is given a postop shoe for comfort and is given crutches to use as needed. Prescription given for oxycodone acetaminophen and he is referred to orthopedics for follow-up.  Dione Booze, MD 10/09/14 [phone]

## 2014-10-09 NOTE — ED Notes (Signed)
Patient reports was hurt tonight at work. States he has to place foot in between parts of machine to balance. States he felt his left foot twist. Complaining of pain to bottom of left foot.

## 2014-10-14 ENCOUNTER — Emergency Department (HOSPITAL_COMMUNITY)
Admission: EM | Admit: 2014-10-14 | Discharge: 2014-10-14 | Disposition: A | Discharge status: Home / Self Care | Payer: Self-pay | Attending: Emergency Medicine | Admitting: Emergency Medicine

## 2014-10-14 ENCOUNTER — Encounter (HOSPITAL_COMMUNITY): Payer: Self-pay | Admitting: Emergency Medicine

## 2014-10-14 DIAGNOSIS — Z720 Tobacco use: Secondary | ICD-10-CM | POA: Insufficient documentation

## 2014-10-14 DIAGNOSIS — X58XXXS Exposure to other specified factors, sequela: Secondary | ICD-10-CM | POA: Insufficient documentation

## 2014-10-14 DIAGNOSIS — S93602S Unspecified sprain of left foot, sequela: Secondary | ICD-10-CM | POA: Insufficient documentation

## 2014-10-14 DIAGNOSIS — Z79899 Other long term (current) drug therapy: Secondary | ICD-10-CM | POA: Insufficient documentation

## 2014-10-14 MED ORDER — HYDROCODONE-ACETAMINOPHEN 5-325 MG PO TABS: 1.0000 | ORAL_TABLET | ORAL | Status: DC | PRN

## 2014-10-14 MED ORDER — IBUPROFEN 400 MG PO TABS
600.0000 mg | ORAL_TABLET | Freq: Once | ORAL | Status: AC
  Administered 2014-10-14: 600 mg via ORAL
  Filled 2014-10-14: qty 2

## 2014-10-14 MED ORDER — IBUPROFEN 600 MG PO TABS: 600.0000 mg | ORAL_TABLET | Freq: Four times a day (QID) | ORAL | Status: AC | PRN

## 2014-10-14 NOTE — ED Notes (Signed)
Patient hurt left foot at work on 10/09/14. Complaining of continued pain. Had to return to work tonight.

## 2014-10-14 NOTE — Discharge Instructions (Signed)
Weight-bear as tolerated. Call and make an appointment to follow-up with the orthopedist.  Foot Sprain The muscles and cord like structures which attach muscle to bone (tendons) that surround the feet are made up of units. A foot sprain can occur at the weakest spot in any of these units. This condition is most often caused by injury to or overuse of the foot, as from playing contact sports, or aggravating a previous injury, or from poor conditioning, or obesity. SYMPTOMS  Pain with movement of the foot.  Tenderness and swelling at the injury site.  Loss of strength is present in moderate or severe sprains. THE THREE GRADES OR SEVERITY OF FOOT SPRAIN ARE:  Mild (Grade I): Slightly pulled muscle without tearing of muscle or tendon fibers or loss of strength.  Moderate (Grade II): Tearing of fibers in a muscle, tendon, or at the attachment to bone, with small decrease in strength.  Severe (Grade III): Rupture of the muscle-tendon-bone attachment, with separation of fibers. Severe sprain requires surgical repair. Often repeating (chronic) sprains are caused by overuse. Sudden (acute) sprains are caused by direct injury or over-use. DIAGNOSIS  Diagnosis of this condition is usually by your own observation. If problems continue, a caregiver may be required for further evaluation and treatment. X-rays may be required to make sure there are not breaks in the bones (fractures) present. Continued problems may require physical therapy for treatment. PREVENTION  Use strength and conditioning exercises appropriate for your sport.  Warm up properly prior to working out.  Use athletic shoes that are made for the sport you are participating in.  Allow adequate time for healing. Early return to activities makes repeat injury more likely, and can lead to an unstable arthritic foot that can result in prolonged disability. Mild sprains generally heal in 3 to 10 days, with moderate and severe sprains taking  2 to 10 weeks. Your caregiver can help you determine the proper time required for healing. HOME CARE INSTRUCTIONS   Apply ice to the injury for 15-20 minutes, 03-04 times per day. Put the ice in a plastic bag and place a towel between the bag of ice and your skin.  An elastic wrap (like an Ace bandage) may be used to keep swelling down.  Keep foot above the level of the heart, or at least raised on a footstool, when swelling and pain are present.  Try to avoid use other than gentle range of motion while the foot is painful. Do not resume use until instructed by your caregiver. Then begin use gradually, not increasing use to the point of pain. If pain does develop, decrease use and continue the above measures, gradually increasing activities that do not cause discomfort, until you gradually achieve normal use.  Use crutches if and as instructed, and for the length of time instructed.  Keep injured foot and ankle wrapped between treatments.  Massage foot and ankle for comfort and to keep swelling down. Massage from the toes up towards the knee.  Only take over-the-counter or prescription medicines for pain, discomfort, or fever as directed by your caregiver. SEEK IMMEDIATE MEDICAL CARE IF:   Your pain and swelling increase, or pain is not controlled with medications.  You have loss of feeling in your foot or your foot turns cold or blue.  You develop new, unexplained symptoms, or an increase of the symptoms that brought you to your caregiver. MAKE SURE YOU:   Understand these instructions.  Will watch your condition.  Will get  help right away if you are not doing well or get worse. Document Released: 01/07/2002 Document Revised: 10/10/2011 Document Reviewed: 03/06/2008 [facility] Patient Information 2015 [place], [place]. This information is not intended to replace advice given to you by your health care provider. Make sure you discuss any questions you have with your health care  provider.

## 2014-10-14 NOTE — ED Provider Notes (Signed)
CSN: [id number]     Arrival date & time 10/14/14  0242 History   First MD Initiated Contact with Patient 10/14/14 [phone]     Chief Complaint  Patient presents with  . Foot Pain     (Consider location/radiation/quality/duration/timing/severity/associated sxs/prior Treatment) Patient is a 34 y.o. male presenting with lower extremity pain.  Foot Pain   She presents with persistent pain to his left foot after injury sustained on 310/16. Is out of pain medications at home. Patient did not follow-up with the orthopedist. He is weightbearing but states he is having pain. There is no swelling or deformity. History reviewed. No pertinent past medical history. History reviewed. No pertinent past surgical history. History reviewed. No pertinent family history. History  Substance Use Topics  . Smoking status: Current Every Day Smoker  . Smokeless tobacco: Not on file  . Alcohol Use: No    Review of Systems  Cardiovascular: Negative for leg swelling.  Musculoskeletal: Positive for arthralgias.  Neurological: Negative for weakness and numbness.  All other systems reviewed and are negative.     Allergies  Review of patient's allergies indicates no known allergies.  Home Medications   Prior to Admission medications   Medication Sig Start Date End Date Taking? Authorizing Provider  azithromycin (ZITHROMAX Z-PAK) 250 MG tablet Take two tablets on day one, then one tab qd days 2-5 07/08/11   Tammi Triplett, PA-C  HYDROcodone-acetaminophen (NORCO) 5-325 MG per tablet Take 1-2 tablets by mouth every 4 (four) hours as needed for moderate pain. 10/14/14   Loren Raceravid Melisssa Donner, MD  ibuprofen (ADVIL,MOTRIN) 600 MG tablet Take 1 tablet (600 mg total) by mouth every 6 (six) hours as needed. 10/14/14   Loren Raceravid Breion Novacek, MD  oxyCODONE-acetaminophen (PERCOCET) 5-325 MG per tablet Take 1 tablet by mouth every 4 (four) hours as needed for moderate pain. 10/09/14   Dione Boozeavid Glick, MD   BP 127/73 mmHg  Pulse 72   Temp(Src) 98.2 F (36.8 C) (Oral)  Resp 16  Ht 5\' 7"  (1.702 m)  Wt 220 lb (99.791 kg)  BMI 34.45 kg/m2  SpO2 100% Physical Exam  Constitutional: He is oriented to person, place, and time. He appears well-developed and well-nourished. No distress.  HENT:  Head: Normocephalic and atraumatic.  Eyes: EOM are normal. Pupils are equal, round, and reactive to light.  Neck: Normal range of motion. Neck supple.  Pulmonary/Chest: Effort normal.  Abdominal: Soft.  Musculoskeletal: Normal range of motion. He exhibits tenderness. He exhibits no edema.  Patient with tenderness to palpation over the medial aspect of the left midfoot and just distal to the left medial malleolus. Patient also has tenderness palpation on the medial surface of the calcaneus. There is no obvious deformity. No contusion. No swelling. Distal pulses intact.  Neurological: He is alert and oriented to person, place, and time.  Skin: Skin is warm and dry. No rash noted. No erythema.  Psychiatric: He has a normal mood and affect. His behavior is normal.  Nursing note and vitals reviewed.   ED Course  Procedures (including critical care time) Labs Review Labs Reviewed - No data to display  Imaging Review No results found.   EKG Interpretation None      MDM   Final diagnoses:  Foot sprain, left, sequela   We'll place in orthotic shoe. Patient states he has crutches. Emphasize the importance of following up with the orthopedist. Return precautions given.    Loren Raceravid Jai Bear, MD 10/14/14 [phone]

## 2014-10-14 NOTE — ED Notes (Signed)
Patient refused cam walker

## 2015-05-26 ENCOUNTER — Encounter (HOSPITAL_COMMUNITY): Payer: Self-pay

## 2015-05-26 ENCOUNTER — Emergency Department (HOSPITAL_COMMUNITY)
Admission: EM | Admit: 2015-05-26 | Discharge: 2015-05-26 | Disposition: A | Discharge status: Home / Self Care | Payer: Self-pay | Attending: Emergency Medicine | Admitting: Emergency Medicine

## 2015-05-26 ENCOUNTER — Emergency Department (HOSPITAL_COMMUNITY): Payer: Self-pay

## 2015-05-26 DIAGNOSIS — M7702 Medial epicondylitis, left elbow: Secondary | ICD-10-CM | POA: Insufficient documentation

## 2015-05-26 DIAGNOSIS — Z87828 Personal history of other (healed) physical injury and trauma: Secondary | ICD-10-CM | POA: Insufficient documentation

## 2015-05-26 MED ORDER — HYDROCODONE-ACETAMINOPHEN 5-325 MG PO TABS: 1.0000 | ORAL_TABLET | ORAL | Status: AC | PRN

## 2015-05-26 MED ORDER — HYDROCODONE-ACETAMINOPHEN 5-325 MG PO TABS
1.0000 | ORAL_TABLET | Freq: Once | ORAL | Status: AC
  Administered 2015-05-26: 1 via ORAL
  Filled 2015-05-26: qty 1

## 2015-05-26 MED ORDER — PREDNISONE 10 MG PO TABS: ORAL_TABLET | ORAL | Status: AC

## 2015-05-26 MED ORDER — PREDNISONE 50 MG PO TABS
60.0000 mg | ORAL_TABLET | Freq: Once | ORAL | Status: AC
  Administered 2015-05-26: 60 mg via ORAL
  Filled 2015-05-26 (×2): qty 1

## 2015-05-26 NOTE — ED Notes (Signed)
Pt reports injured left arm approx 1 year ago.  Reports 2 weeks ago started having a "pinching" pain in left elbow and has progressively gotten worse.  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

## 2015-05-26 NOTE — Discharge Instructions (Signed)
Tendinitis and Tenosynovitis  °Tendinitis is inflammation of the tendon. Tenosynovitis is inflammation of the lining around the tendon (tendon sheath). These painful conditions often occur at once. Tendons attach muscle to bone. To move a limb, force from the muscle moves through the tendon, to the bone. These conditions often cause increased pain when moving. Tendinitis may be caused by a small or partial tear in the tendon.  °SYMPTOMS  °· Pain, tenderness, redness, bruising, or swelling at the injury. °· Loss of normal joint movement. °· Pain that gets worse with use of the muscle and joint attached to the tendon. °· Weakness in the tendon, caused by calcium build up that may occur with tendinitis. °· Commonly affected tendons: °¨ Achilles tendon (calf of leg). °¨ Rotator cuff (shoulder joint). °¨ Patellar tendon (kneecap to shin). °¨ Peroneal tendon (ankle). °¨ Posterior tibial tendon (inner ankle). °¨ Biceps tendon (in front of shoulder). °CAUSES  °· Sudden strain on a flexed muscle, muscle overuse, sudden increase or change in activity, vigorous activity. °· Result of a direct hit (less common). °· Poor muscle action (biomechanics). °RISK INCREASES WITH: °· Injury (trauma). °· Too much exercise. °· Sudden change in athletic activity. °· Incorrect exercise form or technique. °· Poor strength and flexibility. °· Not warming-up properly before activity. °· Returning to activity before healing is complete. °PREVENTION  °· Warm-up and stretch properly before activity. °· Maintain physical fitness: °¨ Joint flexibility. °¨ Muscle strength and endurance. °¨ Fitness that increases heart rate. °· Learn and use proper exercise techniques. °· Use rehabilitation exercises to strengthen weak muscles and tendons. °· Ice the tendon after activity, to reduce recurring inflammation. °· Wear proper fitting protective equipment for specific tendons, when indicated. °PROGNOSIS  °When treated properly, can be cured in 6 to 8 weeks.  Recovery may take longer, depending on degree of injury.  °RELATED COMPLICATIONS  °· Re-injury or recurring symptoms. °· Permanent weakness or joint stiffness, if injury is severe and recovery is not completed. °· Delayed healing, if sports are started before healing is complete. °· Tearing apart (rupture) of the inflamed tendon. Tendinitis means the tendon is injured and must recover. °TREATMENT  °Treatment first involves ice, medicine, and rest from aggravating activities. This reduces pain and inflammation. Modifying your activity may be considered to prevent recurring injury. A brace, elastic bandage wrap, splint, cast, or sling may be prescribed to protect the joint for a short period. After that period, strengthening and stretching exercise may help to regain strength and full range of motion. If the condition persists, despite non-surgical treatment, surgery may be recommended to remove the inflamed tendon lining. Corticosteroid injections may be given to reduce inflammation. However, these injections may weaken the tendon and increase your risk for tendon rupture. °MEDICATION  °· If pain medicine is needed, nonsteroidal anti-inflammatory medicines (aspirin and ibuprofen), or other minor pain relievers (acetaminophen), are often recommended. °· Do not take pain medicine for 7 days before surgery. °· Prescription pain relievers are usually prescribed only after surgery. Use only as directed and only as much as you need. °· Ointments applied to the skin may be helpful. °· Corticosteroid injections may be given to reduce inflammation. However, this may increase your risk of a tendon rupture. °HEAT AND COLD °· Cold treatment (icing) relieves pain and reduces inflammation. Cold treatment should be applied for 10 to 15 minutes every 2 to 3 hours, and immediately after activity that aggravates your symptoms. Use ice packs or an ice massage. °· Heat   treatment may be used before performing stretching and strengthening  activities prescribed by your caregiver, physical therapist, or athletic trainer. Use a heat pack or a warm water soak. °SEEK MEDICAL CARE IF:  °· Symptoms get worse or do not improve, despite treatment. °· Pain becomes too much to tolerate. °· You develop numbness or tingling. °· Toes become cold, or toenails become blue, gray, or dark colored. °· New, unexplained symptoms develop. (Drugs used in treatment may produce side effects.) °  °This information is not intended to replace advice given to you by your health care provider. Make sure you discuss any questions you have with your health care provider. °  °Document Released: 07/18/2005 Document Revised: 10/10/2011 Document Reviewed: 10/30/2008 °Elsevier Interactive Patient Education ©2016 [employer] ° °

## 2015-05-29 NOTE — ED Provider Notes (Signed)
CSN: [id number]     Arrival date & time 05/26/15  1808 History   First MD Initiated Contact with Patient 05/26/15 1910     Chief Complaint  Patient presents with  . Elbow Pain     (Consider location/radiation/quality/duration/timing/severity/associated sxs/prior Treatment) The history is provided by the patient.   Terry Fleming is a 34 y.o. male presenting with worsening left medial elbow pain which started about 2 weeks ago, was originally a "pinching" pain at the site and has become more painful and constant, but worsened with movements such as elbow flexion, but worsened with supination and pronation type maneuvers.  He denies any injuries recently but does endorse a direct blow to the elbow about a year ago, but with resolved symptpoms until now.  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  He is right handed.  He denies redness, swelling at the site.  He has had no treatments prior to arrival.     History reviewed. No pertinent past medical history. History reviewed. No pertinent past surgical history. No family history on file. Social History  Substance Use Topics  . Smoking status: Never Smoker   . Smokeless tobacco: None  . Alcohol Use: No    Review of Systems  Constitutional: Negative for fever.  Musculoskeletal: Positive for arthralgias. Negative for myalgias and joint swelling.  Neurological: Negative for weakness and numbness.      Allergies  Review of patient's allergies indicates no known allergies.  Home Medications   Prior to Admission medications   Medication Sig Start Date End Date Taking? Authorizing Provider  azithromycin (ZITHROMAX Z-PAK) 250 MG tablet Take two tablets on day one, then one tab qd days 2-5 07/08/11   Tammy Triplett, PA-C  HYDROcodone-acetaminophen (NORCO/VICODIN) 5-325 MG tablet Take 1 tablet by mouth every 4 (four) hours as needed. 05/26/15   Burgess AmorJulie Elchanan Bob, PA-C  ibuprofen  (ADVIL,MOTRIN) 600 MG tablet Take 1 tablet (600 mg total) by mouth every 6 (six) hours as needed. 10/14/14   Loren Raceravid Yelverton, MD  oxyCODONE-acetaminophen (PERCOCET) 5-325 MG per tablet Take 1 tablet by mouth every 4 (four) hours as needed for moderate pain. 10/09/14   Dione Boozeavid Glick, MD  predniSONE (DELTASONE) 10 MG tablet 6, 5, 4, 3, 2 then 1 tablet by mouth daily for 6 days total. 05/26/15   Burgess AmorJulie Brette Cast, PA-C   BP 130/79 mmHg  Pulse 77  Temp(Src) 97.8 F (36.6 C) (Oral)  Resp 16  Ht 5\' 7"  (1.702 m)  Wt 200 lb (90.719 kg)  BMI 31.32 kg/m2  SpO2 100% Physical Exam  Constitutional: He appears well-developed and well-nourished.  HENT:  Head: Atraumatic.  Neck: Normal range of motion.  Cardiovascular:  Pulses equal bilaterally  Musculoskeletal: He exhibits tenderness.       Left elbow: He exhibits normal range of motion, no swelling, no effusion and no deformity. Tenderness found. Medial epicondyle tenderness noted.  Neurological: He is alert. He has normal strength. He displays normal reflexes. No sensory deficit. He exhibits normal muscle tone.  Equal grip strength  Skin: Skin is warm and dry.  Psychiatric: He has a normal mood and affect.    ED Course  Procedures (including critical care time) Labs Review   Imaging Review CLINICAL DATA: Pinching pain in the left elbow started 2 weeks ago.  EXAM: LEFT ELBOW - COMPLETE 3+ VIEW  COMPARISON: None.  FINDINGS: There is no evidence of fracture, dislocation, or joint effusion. There  is no evidence of arthropathy or other focal bone abnormality. Soft tissues are unremarkable.  IMPRESSION: Negative.   Electronically Signed By: Richarda Overlie M.D. On: 05/26/2015 18:44  I have personally reviewed and evaluated these images and lab results as part of my medical decision-making.    MDM   Final diagnoses:  Medial epicondylitis of elbow, left    Pt with left medial epicondylitis, probably due to overuse, chronic repetitive  movements.  He was placed in an ace wrap, also suggested he may look for an elbow strap at local pharmacy.  Rest, ice, prednisone taper, hydrocodone. Pt referred to ortho for f/u care, referrals given.    Burgess Amor, PA-C 05/29/15 1350  Zadie Rhine, MD 05/30/15 [phone]

## 2016-04-16 IMAGING — DX DG ANKLE COMPLETE 3+V*L*
3 series · 3 of 3 positions shown · non-contrast
Comparison: None.

CLINICAL DATA: Left ankle pain after injury at work earlier this
evening. Twisting injury.

EXAM:
LEFT ANKLE COMPLETE - 3+ VIEW

[ankle ap]
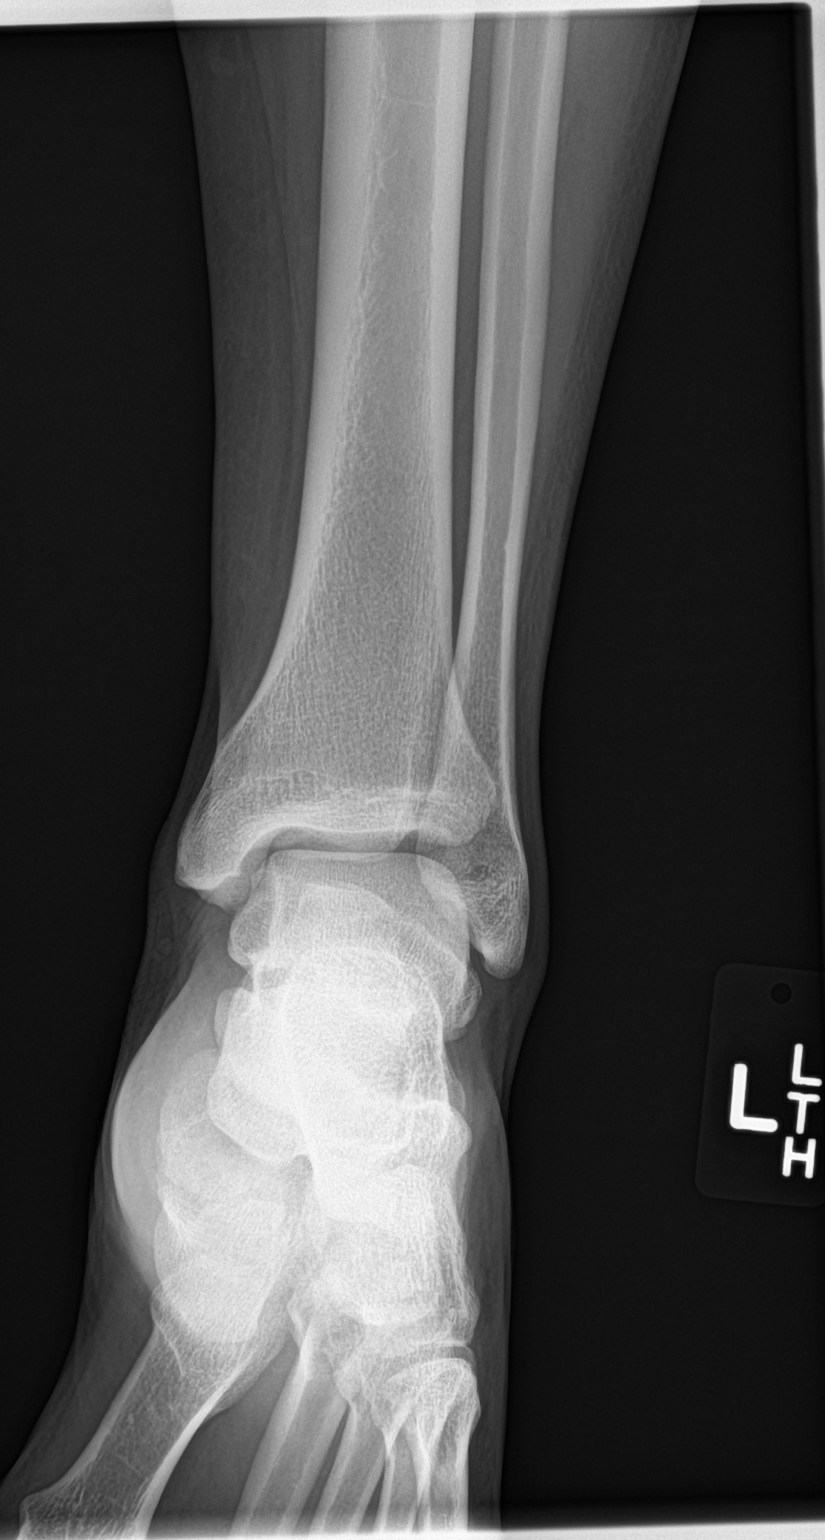

[ankle obl]
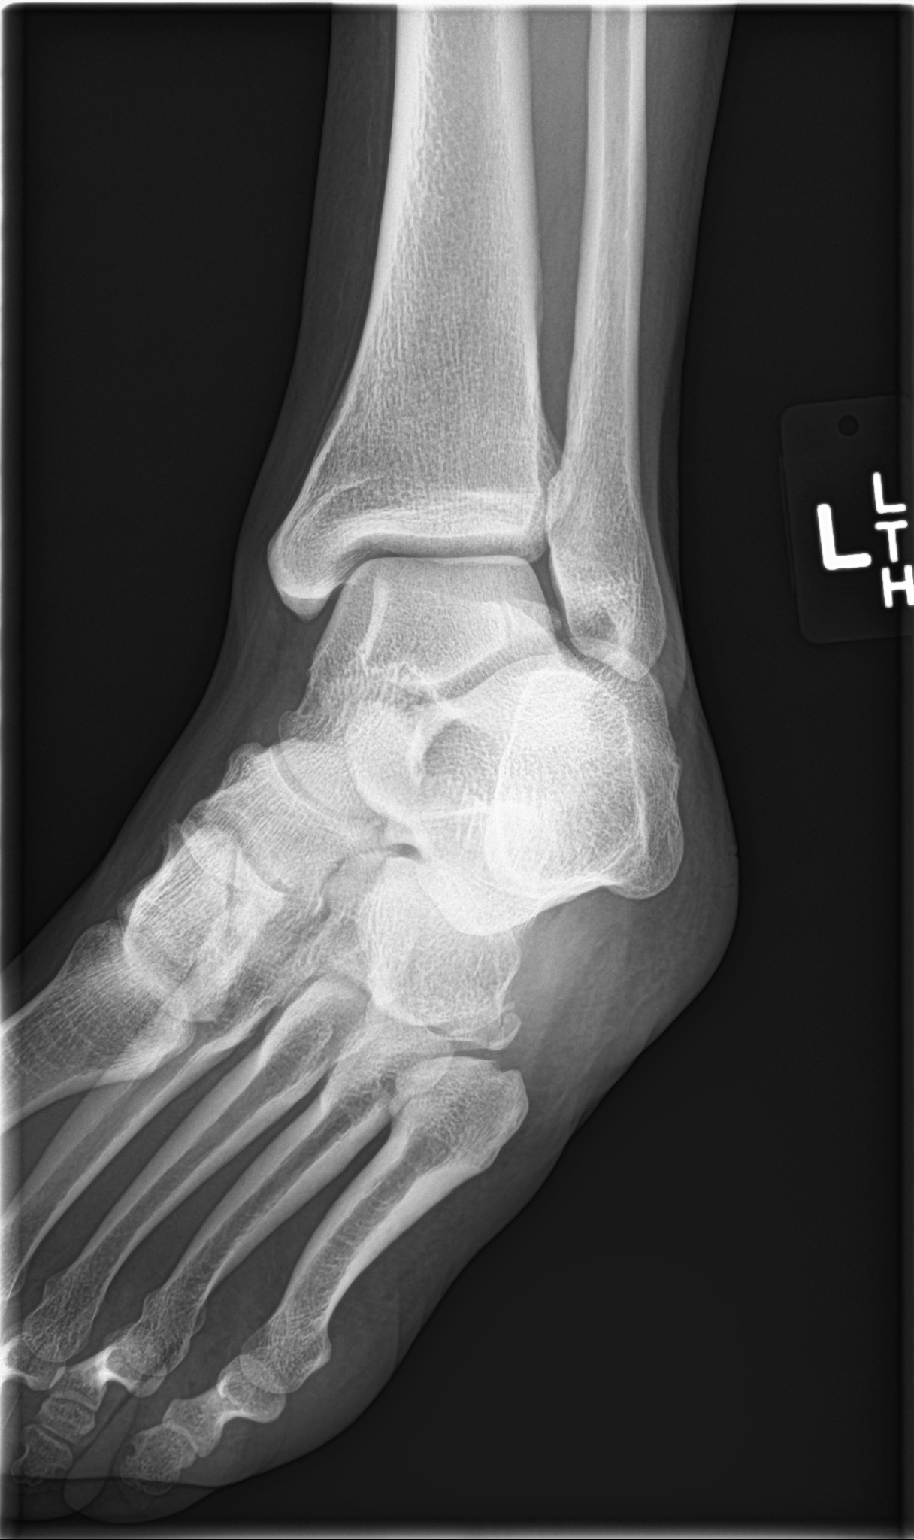

[ankle lat]
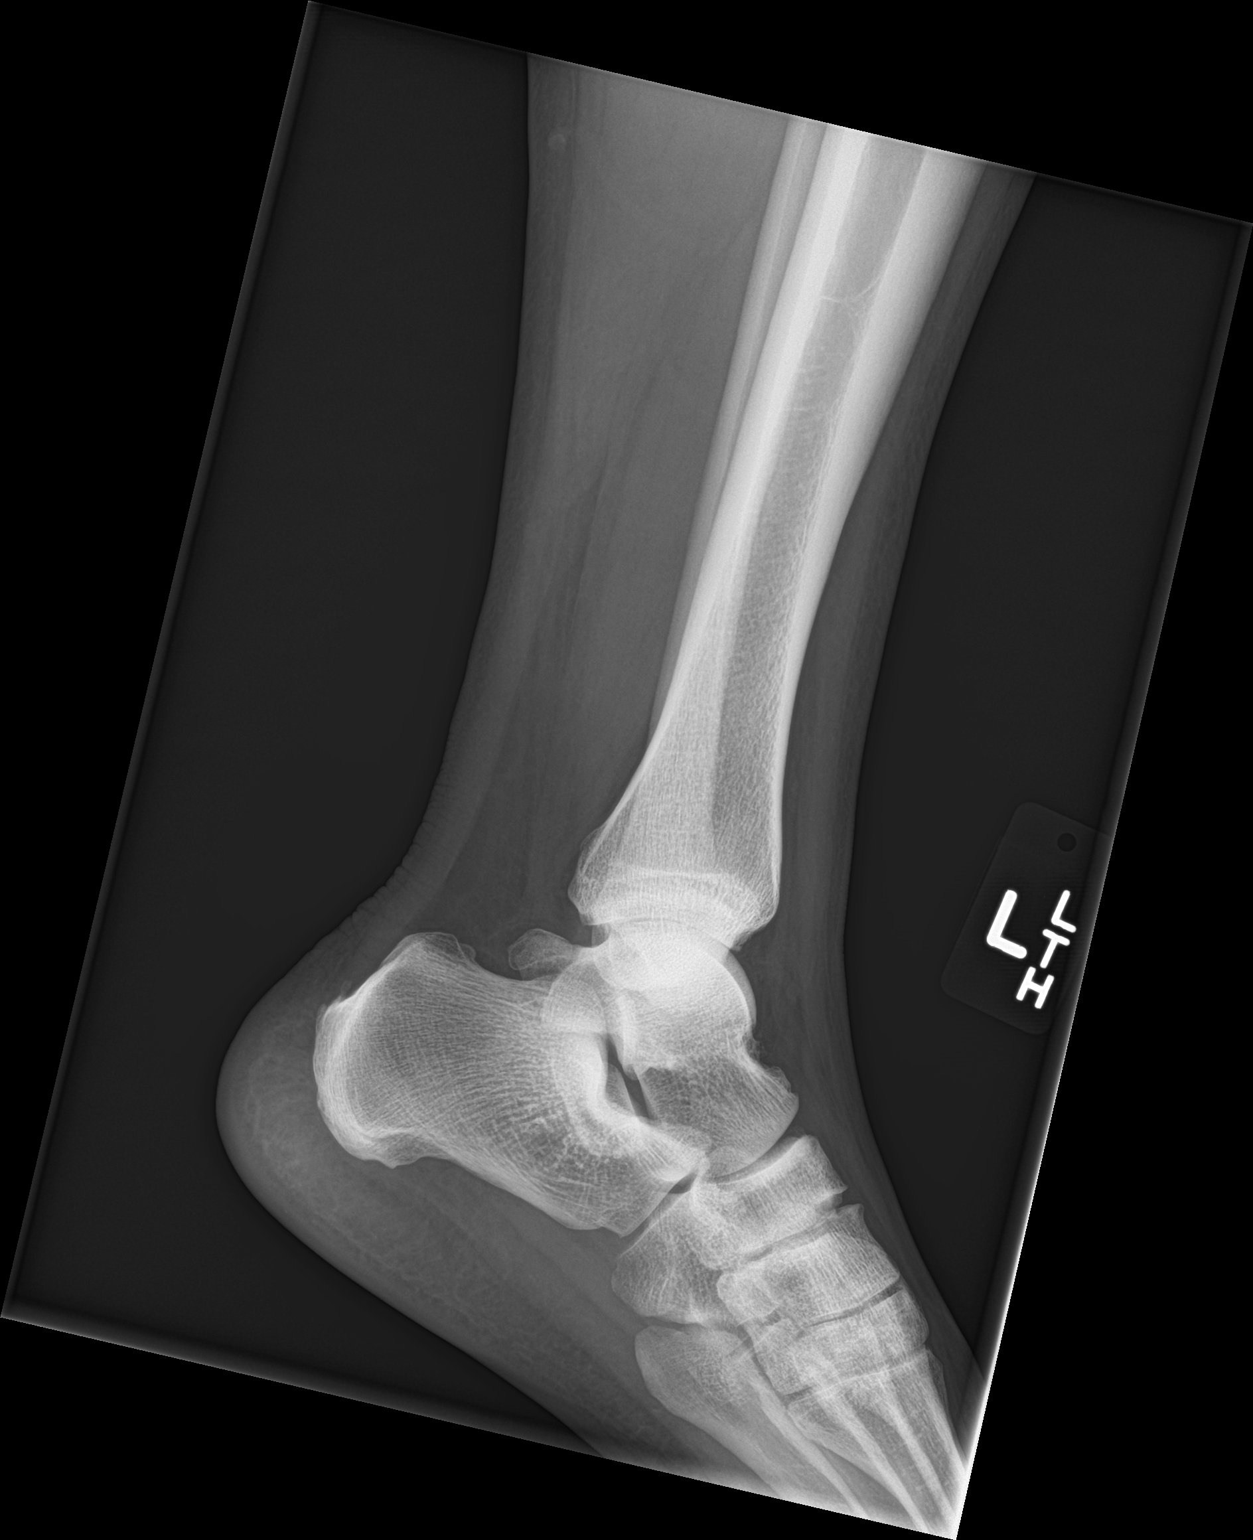

[3 of 3 positions shown; findings below may reference images not displayed]

FINDINGS: No fracture or dislocation. The alignment and joint spaces are
maintained. The ankle mortise is preserved. No focal soft tissue
abnormality or evidence of joint effusion.
IMPRESSION: No fracture or dislocation of the left ankle.
